# Patient Record
Sex: Male | Born: 1995 | Hispanic: Yes | Marital: Single | Smoking: Never smoker
Health system: Southern US, Community
[De-identification: ages and names within clinical notes are randomized; demographics above are authoritative.]

## PROBLEM LIST (undated history)

## (undated) DIAGNOSIS — J40 Bronchitis, not specified as acute or chronic: Secondary | ICD-10-CM

---

## 2015-04-10 ENCOUNTER — Emergency Department (HOSPITAL_COMMUNITY)
Admission: EM | Admit: 2015-04-10 | Discharge: 2015-04-10 | Disposition: A | Discharge status: Home / Self Care | Payer: Self-pay | Attending: Emergency Medicine | Admitting: Emergency Medicine

## 2015-04-10 ENCOUNTER — Encounter (HOSPITAL_COMMUNITY): Payer: Self-pay | Admitting: *Deleted

## 2015-04-10 DIAGNOSIS — Z8709 Personal history of other diseases of the respiratory system: Secondary | ICD-10-CM | POA: Insufficient documentation

## 2015-04-10 DIAGNOSIS — B349 Viral infection, unspecified: Secondary | ICD-10-CM | POA: Insufficient documentation

## 2015-04-10 HISTORY — DX: Bronchitis, not specified as acute or chronic: J40

## 2015-04-10 MED ORDER — ONDANSETRON HCL 4 MG PO TABS
4.0000 mg | ORAL_TABLET | Freq: Once | ORAL | Status: AC
  Administered 2015-04-10: 4 mg via ORAL
  Filled 2015-04-10: qty 1

## 2015-04-10 MED ORDER — METOCLOPRAMIDE HCL 5 MG PO TABS
5.0000 mg | ORAL_TABLET | Freq: Once | ORAL | Status: AC
  Administered 2015-04-10: 5 mg via ORAL
  Filled 2015-04-10: qty 1

## 2015-04-10 MED ORDER — IBUPROFEN 400 MG PO TABS
800.0000 mg | ORAL_TABLET | Freq: Once | ORAL | Status: AC
  Administered 2015-04-10: 800 mg via ORAL
  Filled 2015-04-10: qty 2

## 2015-04-10 MED ORDER — DIPHENHYDRAMINE HCL 25 MG PO CAPS
25.0000 mg | ORAL_CAPSULE | Freq: Once | ORAL | Status: AC
  Administered 2015-04-10: 25 mg via ORAL
  Filled 2015-04-10: qty 1

## 2015-04-10 NOTE — ED Provider Notes (Signed)
CSN: [id number]     Arrival date & time 04/10/15  2024 History  This chart was scribed for Eyvonne Mechanic, PA-C, working with Linwood Dibbles, MD by Elon Spanner, ED Scribe. This patient was seen in room TR01C/TR01C and the patient's care was started at 9:55 PM.     Chief Complaint  Patient presents with  . Generalized Body Aches   The history is provided by the patient. No language interpreter was used.   HPI Comments:  Robert Zimmerman is a 19 y.o. male who presents to the Emergency Department complaining of fatigue, generalized body aches and nausea; onset 11:00 am today.  Patient reports that he was able to work today but shortly after symptoms started he had to stop working. He reports that he took ibuprofen this slightly improved his muscular complaints. Patient notes he was able to eat lunch today, and 24 ounce Gatorade. He reports generalized headache, describes it as pulsating. Patient reports that his body aches are diffuse, worse with movement, no focal abnormalities. 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 Patient denies neck pain, stiffness, altered mental status, focal neurological deficits, weakness, chest pain, cough, fever, abdominal pain, changes in urine color clarity or characteristics, decreased range of motion of his joints. No swelling of the joints. Patient reports that he's had multiple episodes of the same presentation previously, approximately every 2 months. Wife is present at the time of evaluation who is working at [employer] nurse at his request, she reports that this happens often, uncertain etiology, usually resolves on its own. Patient denies any insect bites, exposure to abnormal food or drink, anyone around him with similar symptoms.   Past Medical History  Diagnosis Date  . Bronchitis    History reviewed. No pertinent past  surgical history. No family history on file. Social History  Substance Use Topics  . Smoking status: Never Smoker   . Smokeless tobacco: Never Used  . Alcohol Use: Yes     Comment: socially    Review of Systems  All other systems reviewed and are negative.   Allergies  Review of patient's allergies indicates no known allergies.  Home Medications   Prior to Admission medications   Not on File   BP 104/56 mmHg  Pulse 81  Temp(Src) 99.9 F (37.7 C) (Oral)  Resp 14  Ht 5\' 5"  (1.651 m)  Wt 191 lb 2 oz (86.694 kg)  BMI 31.80 kg/m2  SpO2 95%   Physical Exam  Constitutional: He is oriented to person, place, and time. He appears well-developed and well-nourished. No distress.  HENT:  Head: Normocephalic and atraumatic.  Mouth/Throat: Oropharynx is clear and moist. No oropharyngeal exudate.  Eyes: Conjunctivae and EOM are normal. Pupils are equal, round, and reactive to light.  Neck: Normal range of motion. Neck supple. No tracheal deviation present.  Cardiovascular: Normal rate, regular rhythm, normal heart sounds and intact distal pulses.  Exam reveals no gallop and no friction rub.   No murmur heard. Pulmonary/Chest: Effort normal and breath sounds normal. No respiratory distress. He has no wheezes. He has no rales. He exhibits no tenderness.  Abdominal: Soft. He exhibits no distension and no mass. There is no tenderness. There is no rebound and no guarding.  Musculoskeletal: Normal range of motion. He exhibits no edema or tenderness.  Neurological: He is alert and oriented to person, place, and time. He has normal strength.  No cranial nerve deficit or sensory deficit. GCS eye subscore is 4. GCS verbal subscore is 5. GCS motor subscore is 6.  Skin: Skin is warm and dry. He is not diaphoretic.  Psychiatric: He has a normal mood and affect. His behavior is normal.  Nursing note and vitals reviewed.   ED Course  Procedures (including critical care time) Labs Review Labs  Reviewed - No data to display  Imaging Review No results found. I have personally reviewed and evaluated these images and lab results as part of my medical decision-making.   EKG Interpretation None      MDM   Final diagnoses:  Viral illness   Labs:  Imaging:  Therapeutics: Ibuprofen 800 mg; Zofran 4 mg; Benadryl 25 mg; Zofran 4 mg; Reglan 5 mg  Assessment/Plan: Patient presents today with headache, diffuse body aches, fatigue. With patient's headache, meningitis was considered, patient has no altered mental status, he was originally afebrile without any recent antipyretics.He has full active range of motion of his neck and back, this is unlikely meningitis. Patient's presentation most likely represents the beginning of viral infection. He does have some nausea, but has no diarrhea at this time. Patient's vital signs are reassuring, he is nontoxic-appearing, tolerating by mouth. Patient was treated here in the ED with above medications with improvement in his headache symptoms. Due to the recent onset of symptoms it is difficult this time to determine acute cause for patient's symptoms. He is stable and will be discharged home with instructions to monitor for increase in temperature, nausea, vomiting, diarrhea, neck stiffness, changes in mental status, or any other significant sign or symptom. He is instructed return immediately to the emergency room if any present. Both him and his wife both verbalized understanding and agreement today's plan and had no further questions or concerns at time of discharge.  Discharge Meds: Ibuprofen  I personally performed the services described in this documentation, which was scribed in my presence. The recorded information has been reviewed and is accurate.    Eyvonne Mechanic, PA-C 04/13/15 1022  Linwood Dibbles, MD 04/16/15 [phone]

## 2015-04-10 NOTE — ED Notes (Signed)
Patient presents with c/o body aches and nausea  States it started this morning around 10am  Took 2 Aleve at noon and then noticed body aches

## 2015-04-10 NOTE — Discharge Instructions (Signed)
Please use ibuprofen or Tylenol as needed for pain and fever. If symptoms persist or worsen please return to the emergency room for further evaluation and management. He fluids.

## 2016-10-11 ENCOUNTER — Emergency Department (HOSPITAL_COMMUNITY): Payer: Self-pay

## 2016-10-11 ENCOUNTER — Emergency Department (HOSPITAL_COMMUNITY)
Admission: EM | Admit: 2016-10-11 | Discharge: 2016-10-12 | Disposition: A | Discharge status: Home / Self Care | Payer: Self-pay | Attending: Emergency Medicine | Admitting: Emergency Medicine

## 2016-10-11 ENCOUNTER — Encounter (HOSPITAL_COMMUNITY): Payer: Self-pay

## 2016-10-11 DIAGNOSIS — R109 Unspecified abdominal pain: Secondary | ICD-10-CM

## 2016-10-11 DIAGNOSIS — K760 Fatty (change of) liver, not elsewhere classified: Secondary | ICD-10-CM | POA: Insufficient documentation

## 2016-10-11 DIAGNOSIS — R945 Abnormal results of liver function studies: Secondary | ICD-10-CM | POA: Insufficient documentation

## 2016-10-11 DIAGNOSIS — R3129 Other microscopic hematuria: Secondary | ICD-10-CM | POA: Insufficient documentation

## 2016-10-11 DIAGNOSIS — R7989 Other specified abnormal findings of blood chemistry: Secondary | ICD-10-CM

## 2016-10-11 LAB — URINALYSIS, ROUTINE W REFLEX MICROSCOPIC
BILIRUBIN URINE: NEGATIVE
Bacteria, UA: NONE SEEN
GLUCOSE, UA: NEGATIVE mg/dL
KETONES UR: NEGATIVE mg/dL
LEUKOCYTES UA: NEGATIVE
NITRITE: NEGATIVE
PROTEIN: NEGATIVE mg/dL
Specific Gravity, Urine: 1.023 (ref 1.005–1.030)
Squamous Epithelial / LPF: NONE SEEN
WBC, UA: NONE SEEN WBC/hpf (ref 0–5)
pH: 7 (ref 5.0–8.0)

## 2016-10-11 MED ORDER — SODIUM CHLORIDE 0.9 % IV BOLUS (SEPSIS)
1000.0000 mL | Freq: Once | INTRAVENOUS | Status: AC
  Administered 2016-10-11: 1000 mL via INTRAVENOUS

## 2016-10-11 MED ORDER — MORPHINE SULFATE (PF) 4 MG/ML IV SOLN
4.0000 mg | Freq: Once | INTRAVENOUS | Status: AC
  Administered 2016-10-11: 4 mg via INTRAVENOUS
  Filled 2016-10-11: qty 1

## 2016-10-11 NOTE — ED Notes (Signed)
Patient transported to X-ray 

## 2016-10-11 NOTE — ED Triage Notes (Signed)
Pt complaining of L sided flank pain. Pt denies any injury/trauma. Pt complaining of burning with urination. No hx of same.

## 2016-10-11 NOTE — ED Provider Notes (Signed)
[facility]-EMERGENCY DEPT Provider Note   CSN: [id number] Arrival date & time: 10/11/16  2222     History   Chief Complaint Chief Complaint  Patient presents with  . Flank Pain    HPI Sire Poet is a 21 y.o. male with a PMHx of bronchitis, who presents to the ED with complaints of sudden onset left flank pain that began at 10 PM approximately 1 hour prior to evaluation. He describes the pain as 10/10 constant sharp left flank pain radiating to the left lateral and lower abdomen, with no known aggravating factors, and no known alleviating factors given that he has not tried anything prior to arrival. He states that earlier this morning he noticed he had some burning dysuria with urination and cloudy urine. He denies any malodorous urine, hematuria, increased urinary frequency, testicular pain or swelling, penile discharge, fevers, chills, CP, SOB, N/V/D/C, obstipation, melena, hematochezia, myalgias, arthralgias, numbness, tingling, focal weakness, or any other complaints at this time. Denies recent travel, sick contacts, suspicious food intake, alcohol use, NSAIDs use, or prior abdominal surgeries. Denies history of kidney stones or any similar symptoms like tonight.   The history is provided by the patient and medical records. No language interpreter was used.  Flank Pain  This is a new problem. The current episode started less than 1 hour ago. The problem occurs constantly. The problem has not changed since onset.Associated symptoms include abdominal pain. Pertinent negatives include no chest pain and no shortness of breath. Nothing aggravates the symptoms. Nothing relieves the symptoms. He has tried nothing for the symptoms. The treatment provided no relief.    Past Medical History:  Diagnosis Date  . Bronchitis     There are no active problems to display for this patient.   History reviewed. No pertinent surgical history.     Home Medications    Prior to Admission medications     Not on File    Family History History reviewed. No pertinent family history.  Social History Social History  Substance Use Topics  . Smoking status: Never Smoker  . Smokeless tobacco: Never Used  . Alcohol use Yes     Comment: socially     Allergies   Patient has no known allergies.   Review of Systems Review of Systems  Constitutional: Negative for chills and fever.  Respiratory: Negative for shortness of breath.   Cardiovascular: Negative for chest pain.  Gastrointestinal: Positive for abdominal pain. Negative for blood in stool, constipation, diarrhea, nausea and vomiting.  Genitourinary: Positive for dysuria and flank pain. Negative for discharge, frequency, hematuria, scrotal swelling and testicular pain.       +cloudy urine, not malodorous  Musculoskeletal: Negative for arthralgias and myalgias.  Skin: Negative for color change.  Allergic/Immunologic: Negative for immunocompromised state.  Neurological: Negative for weakness and numbness.  Psychiatric/Behavioral: Negative for confusion.   10 Systems reviewed and are negative for acute change except as noted in the HPI.   Physical Exam Updated Vital Signs BP 115/82 (BP Location: Right Arm)   Pulse 78   Temp 98.4 F (36.9 C) (Oral)   Resp 17   SpO2 98%   Physical Exam  Constitutional: He is oriented to person, place, and time. Vital signs are normal. He appears well-developed and well-nourished.  Non-toxic appearance. No distress.  Afebrile, nontoxic, NAD  HENT:  Head: Normocephalic and atraumatic.  Mouth/Throat: Oropharynx is clear and moist and mucous membranes are normal.  Eyes: Conjunctivae and EOM are normal. Right eye exhibits no  discharge. Left eye exhibits no discharge.  Neck: Normal range of motion. Neck supple.  Cardiovascular: Normal rate, regular rhythm, normal heart sounds and intact distal pulses.  Exam reveals no gallop and no friction rub.   No murmur heard. Pulmonary/Chest: Effort normal  and breath sounds normal. No respiratory distress. He has no decreased breath sounds. He has no wheezes. He has no rhonchi. He has no rales.  Abdominal: Soft. Normal appearance and bowel sounds are normal. He exhibits no distension. There is tenderness in the left lower quadrant. There is CVA tenderness. There is no rigidity, no rebound, no guarding, no tenderness at McBurney's point and negative Murphy's sign.    Soft, nondistended, +BS throughout, with mild LLQ TTP tracking towards the L lateral abdomen and flank region, no r/g/r, neg murphy's, neg mcburney's, +L-sided CVA TTP   Musculoskeletal: Normal range of motion.  Neurological: He is alert and oriented to person, place, and time. He has normal strength. No sensory deficit.  Skin: Skin is warm, dry and intact. No rash noted.  Psychiatric: He has a normal mood and affect.  Nursing note and vitals reviewed.    ED Treatments / Results  Labs (all labs ordered are listed, but only abnormal results are displayed) Labs Reviewed  URINALYSIS, ROUTINE W REFLEX MICROSCOPIC - Abnormal; Notable for the following:       Result Value   APPearance HAZY (*)    Hgb urine dipstick LARGE (*)    All other components within normal limits  COMPREHENSIVE METABOLIC PANEL - Abnormal; Notable for the following:    Glucose, Bld 125 (*)    AST 42 (*)    ALT 79 (*)    All other components within normal limits  CBC WITH DIFFERENTIAL/PLATELET  LIPASE, BLOOD    EKG  EKG Interpretation None       Radiology Ct Renal Stone Study  Result Date: 10/12/2016 CLINICAL DATA:  Left flank pain. No injury. Burning with urination. EXAM: CT ABDOMEN AND PELVIS WITHOUT CONTRAST TECHNIQUE: Multidetector CT imaging of the abdomen and pelvis was performed following the standard protocol without IV contrast. COMPARISON:  None. FINDINGS: Lower chest: Atelectasis in the lung bases. Hepatobiliary: Diffuse fatty infiltration of the liver. Gallbladder is contracted but appears  otherwise normal. No stones identified. No bile duct dilatation. Pancreas: Unremarkable. No pancreatic ductal dilatation or surrounding inflammatory changes. Spleen: Normal in size without focal abnormality. Adrenals/Urinary Tract: No adrenal gland nodules. Kidneys are symmetrical. No hydronephrosis or hydroureter. No renal, ureteral, or bladder stones. Bladder is decompressed. Stomach/Bowel: Stomach is unremarkable and filled with fluid and food. Small bowel are decompressed. Stool throughout the colon. No colonic distention or wall thickening. Appendix is normal. Vascular/Lymphatic: No significant vascular findings are present. No enlarged abdominal or pelvic lymph nodes. Reproductive: Prostate gland is enlarged, measuring 4.9 cm diameter. Consider prostatitis in a patient of this age. Other: No free air or free fluid. Abdominal wall musculature appears intact. Musculoskeletal: No acute or significant osseous findings. IMPRESSION: No renal or ureteral stone or obstruction. Diffuse fatty infiltration of the liver. Prominent prostate enlargement especially for patient age. Electronically Signed   By: Burman NievesWilliam  Stevens M.D.   On: 10/12/2016 00:08    Procedures Procedures (including critical care time)  Medications Ordered in ED Medications  morphine 4 MG/ML injection 4 mg (4 mg Intravenous Given 10/11/16 2352)  sodium chloride 0.9 % bolus 1,000 mL (0 mLs Intravenous Stopped 10/12/16 0054)     Initial Impression / Assessment and Plan / ED  Course  I have reviewed the triage vital signs and the nursing notes.  Pertinent labs & imaging results that were available during my care of the patient were reviewed by me and considered in my medical decision making (see chart for details).     21 y.o. male here with sudden onset L flank pain radiating to LLQ, mild dysuria and cloudy urine starting this morning. On exam, LLQ TTP tracking towards the L flank, +L CVA TTP, nonperitoneal. U/A obtained in triage which  shows TNTC RBCs but no WBCs or bacteria, leuk and nitrite negative. DDx includes nephrolithiasis vs pancreatic etiology vs intestinal etiology. Will get labs and CT renal study. Will give pain meds and fluids, then reassess shortly  1:42 AM CBC w/diff unremarkable. CMP with mildly elevated LFTs (AST 42, ALT 79) but otherwise unremarkable. Lipase WNL. CT without evidence of kidney stone, diffuse fatty liver which would explain the marginally elevated LFTs; mild prostatic enlargement for age. Given lack of bacteria or infectious signs on U/A, and lack of penile discharge/fever/leukocytosis, doubt prostatitis, likely benign enlargement. Symptoms could be from passed kidney stone, especially given the hematuria on U/A. Pt feeling much better, pain resolved, no return of symptoms. Will d/c home with pain meds. [place] reviewed prior to dispensing controlled substance medications, and was notable for: NONE found on 1 year search. Risks/benefits/alternatives and expectations discussed regarding controlled substances. Side effects of medications discussed. Informed consent obtained.   Advised pt to stay well hydrated, and f/up with [facility] in 1wk for recheck of symptoms and to establish medical care; doubt need for urology f/up at this time, but if symptoms return/persist, or he has prostatic symptoms, then it may warrant urology f/up; will defer to [facility]/PCP for referral decisions.   I explained the diagnosis and have given explicit precautions to return to the ER including for any other new or worsening symptoms. The patient understands and accepts the medical plan as it's been dictated and I have answered their questions. Discharge instructions concerning home care and prescriptions have been given. The patient is STABLE and is discharged to home in good condition.    Final Clinical Impressions(s) / ED Diagnoses   Final diagnoses:  Left flank pain  Other microscopic hematuria  Fatty liver  Elevated  LFTs    New Prescriptions New Prescriptions   HYDROCODONE-ACETAMINOPHEN (NORCO) 5-325 MG TABLET    Take 1 tablet by mouth every 6 (six) hours as needed for severe pain.   NAPROXEN (NAPROSYN) 500 MG TABLET    Take 1 tablet (500 mg total) by mouth 2 (two) times daily as needed for mild pain, moderate pain or headache (TAKE WITH MEALS.).     53 Linda Ocia Simek, PA-C 10/12/16 8119    Derwood Kaplan, MD 10/12/16 2308

## 2016-10-12 LAB — COMPREHENSIVE METABOLIC PANEL
ALBUMIN: 3.9 g/dL (ref 3.5–5.0)
ALT: 79 U/L — AB (ref 17–63)
AST: 42 U/L — AB (ref 15–41)
Alkaline Phosphatase: 68 U/L (ref 38–126)
Anion gap: 8 (ref 5–15)
BUN: 11 mg/dL (ref 6–20)
CHLORIDE: 105 mmol/L (ref 101–111)
CO2: 25 mmol/L (ref 22–32)
CREATININE: 0.78 mg/dL (ref 0.61–1.24)
Calcium: 9 mg/dL (ref 8.9–10.3)
GFR calc Af Amer: >60 mL/min (ref >60)
GLUCOSE: 125 mg/dL — AB (ref 65–99)
Potassium: 3.8 mmol/L (ref 3.5–5.1)
Sodium: 138 mmol/L (ref 135–145)
Total Bilirubin: 0.7 mg/dL (ref 0.3–1.2)
Total Protein: 6.9 g/dL (ref 6.5–8.1)

## 2016-10-12 LAB — CBC WITH DIFFERENTIAL/PLATELET
BASOS ABS: 0 10*3/uL (ref 0.0–0.1)
Basophils Relative: 0 %
EOS PCT: 2 %
Eosinophils Absolute: 0.2 10*3/uL (ref 0.0–0.7)
HCT: 43.2 % (ref 39.0–52.0)
Hemoglobin: 14.4 g/dL (ref 13.0–17.0)
LYMPHS PCT: 28 %
Lymphs Abs: 2.6 10*3/uL (ref 0.7–4.0)
MCH: 30.8 pg (ref 26.0–34.0)
MCHC: 33.3 g/dL (ref 30.0–36.0)
MCV: 92.5 fL (ref 78.0–100.0)
Monocytes Absolute: 1 10*3/uL (ref 0.1–1.0)
Monocytes Relative: 11 %
Neutro Abs: 5.3 10*3/uL (ref 1.7–7.7)
Neutrophils Relative %: 59 %
PLATELETS: 214 10*3/uL (ref 150–400)
RBC: 4.67 MIL/uL (ref 4.22–5.81)
RDW: 13 % (ref 11.5–15.5)
WBC: 9.2 10*3/uL (ref 4.0–10.5)

## 2016-10-12 LAB — LIPASE, BLOOD: LIPASE: 20 U/L (ref 11–51)

## 2016-10-12 MED ORDER — HYDROCODONE-ACETAMINOPHEN 5-325 MG PO TABS: 1.0000 | ORAL_TABLET | Freq: Four times a day (QID) | ORAL | 0 refills | Status: AC | PRN

## 2016-10-12 MED ORDER — NAPROXEN 500 MG PO TABS: 500.0000 mg | ORAL_TABLET | Freq: Two times a day (BID) | ORAL | 0 refills | Status: AC | PRN

## 2016-10-12 NOTE — Discharge Instructions (Signed)
Your lab work showed that you have mildly elevated liver enzymes, likely related to a fatty liver which was seen on CT scan. You also have a mildly enlarged prostate. No kidney stones were seen on the CT scan, however you could have passed the kidney stone already, which is why you had blood in your urine test. Take naprosyn as directed as needed for pain using norco as directed as needed for breakthrough pain. Do not drive or operate machinery with pain medication use. May need over-the-counter stool softener with this pain medication use. Follow-up with [facility] in the next 1 to 2 weeks for recheck of ongoing pain and to establish medical care, however for intractable or uncontrollable pain at home or any other changes/worsening symptoms, then return to the  emergency department.

## 2018-05-01 IMAGING — CT CT RENAL STONE PROTOCOL
2 of 4 series · 17 of 46 positions shown, 19 images · non-contrast
Comparison: None.

CLINICAL DATA: Left flank pain. No injury. Burning with urination.

EXAM:
CT ABDOMEN AND PELVIS WITHOUT CONTRAST
TECHNIQUE: Multidetector CT imaging of the abdomen and pelvis was performed
following the standard protocol without IV contrast.

[Series 3: stone study 5.0 i30f 2 · axial · 0.80mm/px · z∈[+830,+1260]mm · 14 of 94 slices shown, 16 images]
[im 4/94  soft-tissue]
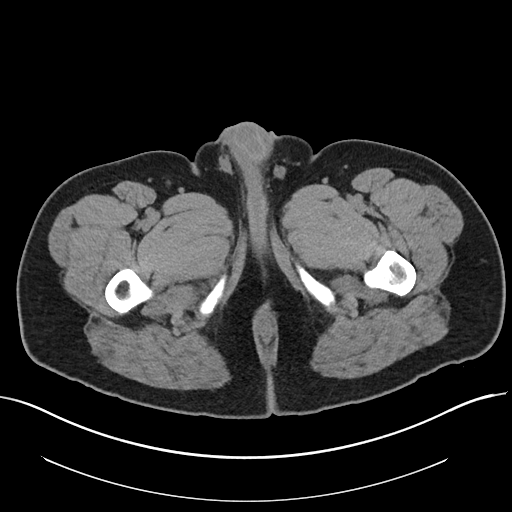
[im 4/94  bone]
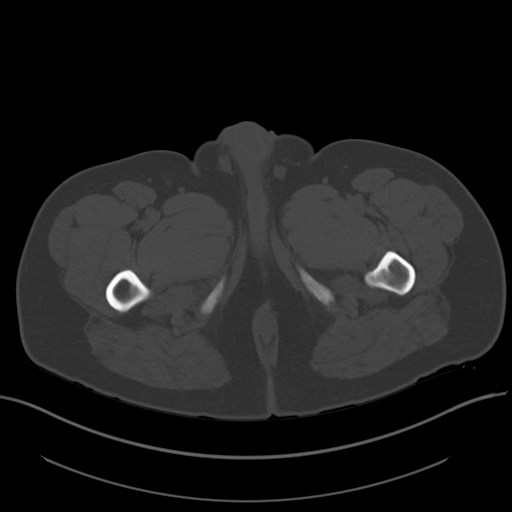
[im 10/94  soft-tissue]
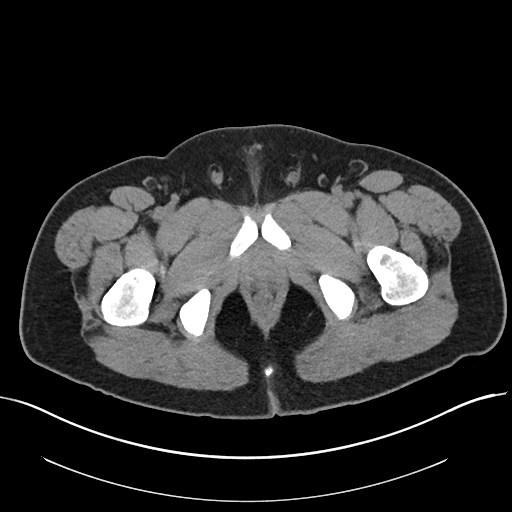
[im 17/94  soft-tissue]
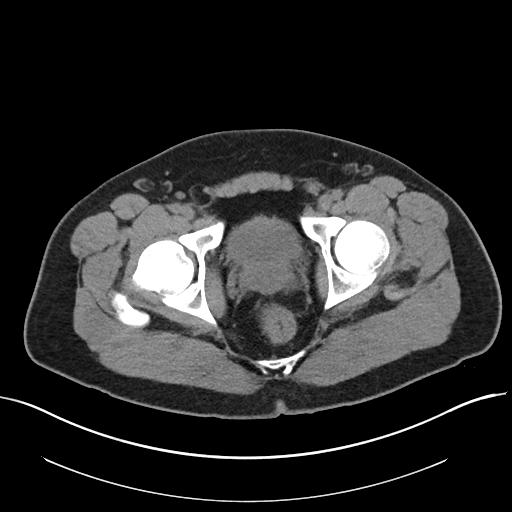
[im 24/94  soft-tissue]
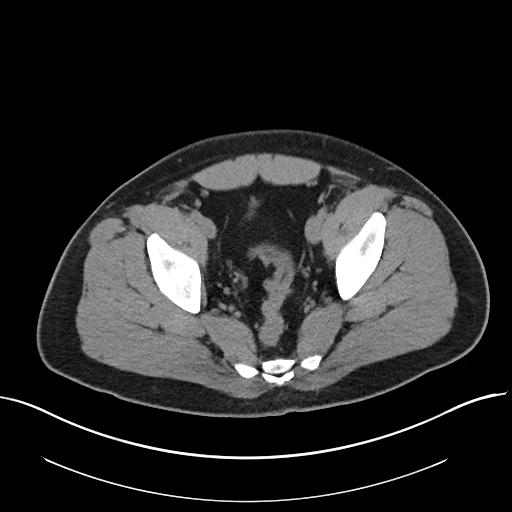
[im 30/94  soft-tissue]
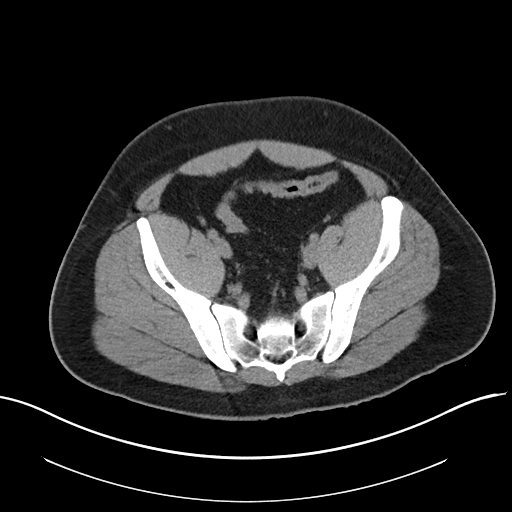
[im 37/94  soft-tissue]
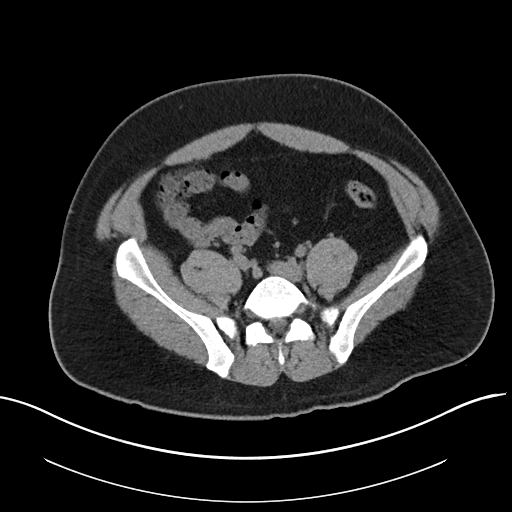
[im 44/94  soft-tissue]
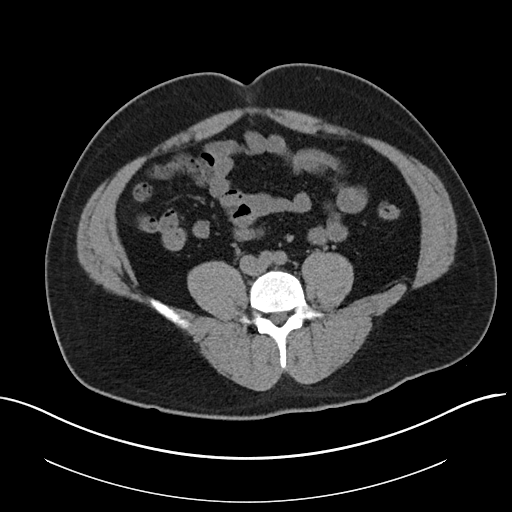
[im 50/94  soft-tissue]
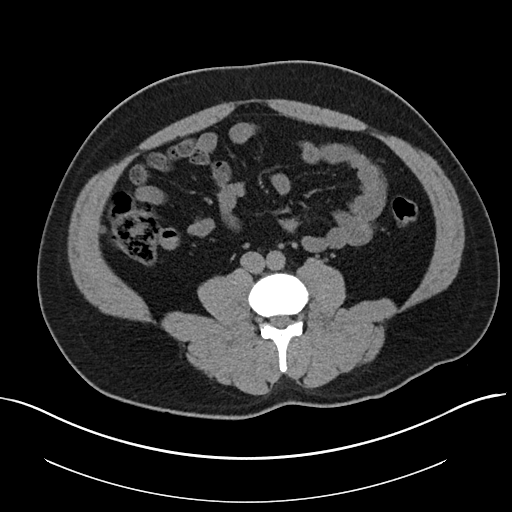
[im 57/94  soft-tissue]
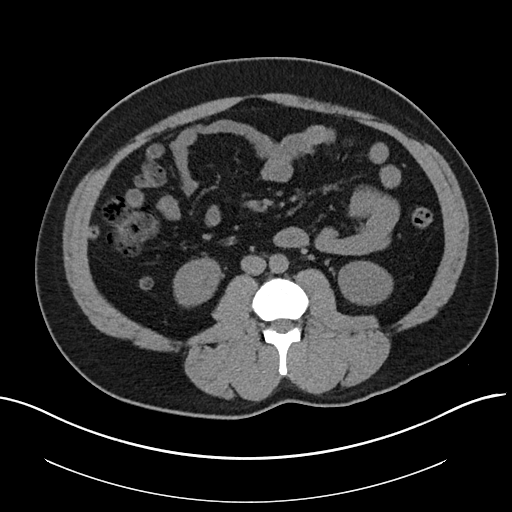
[im 57/94  bone]
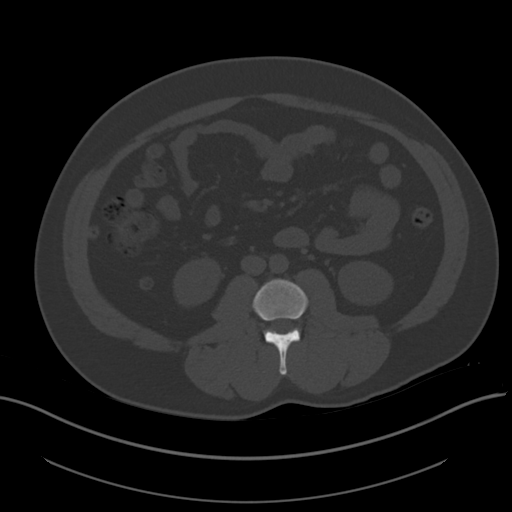
[im 64/94  soft-tissue]
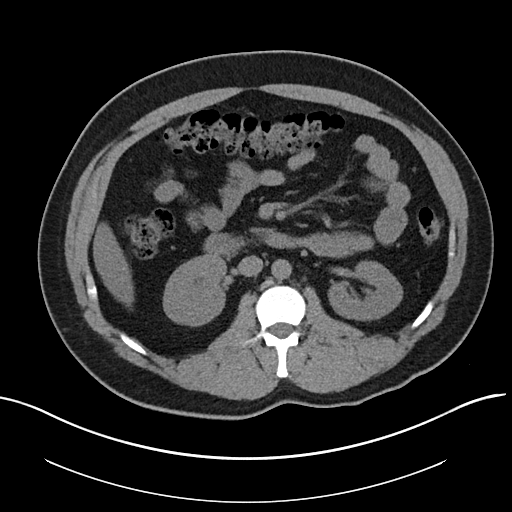
[im 70/94  soft-tissue]
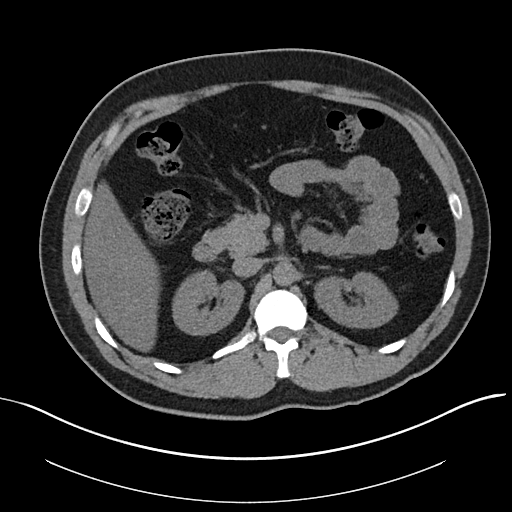
[im 77/94  soft-tissue]
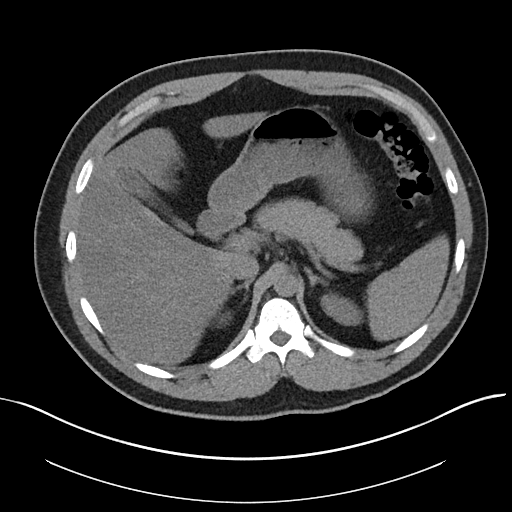
[im 84/94  soft-tissue]
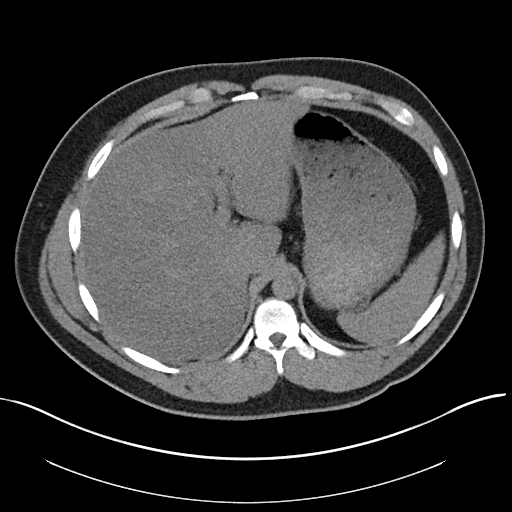
[im 90/94  soft-tissue]
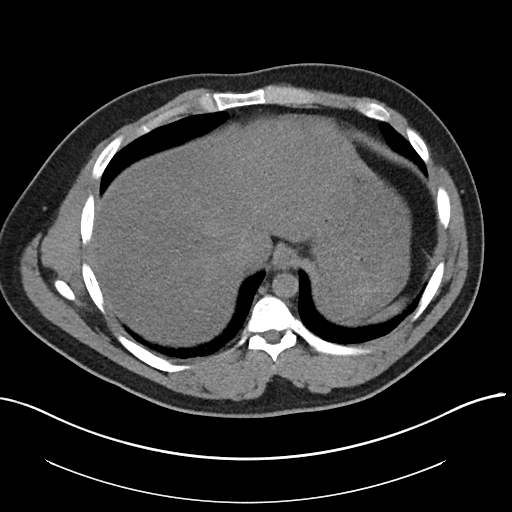

[Series 6: coronal soft tissue · coronal · 0.84mm/px · 3 of 91 slices shown]
[im 31/91  soft-tissue]
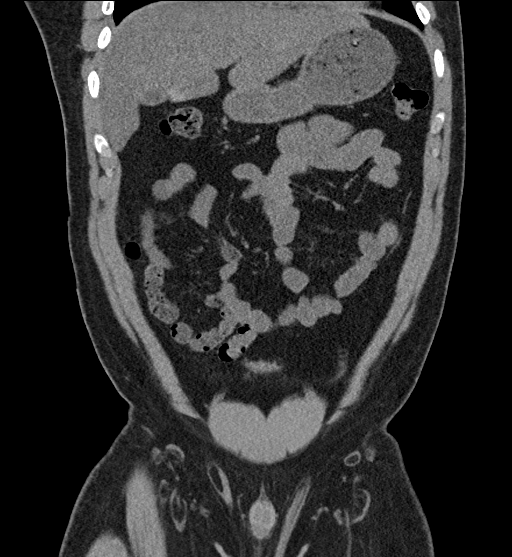
[im 41/91  soft-tissue]
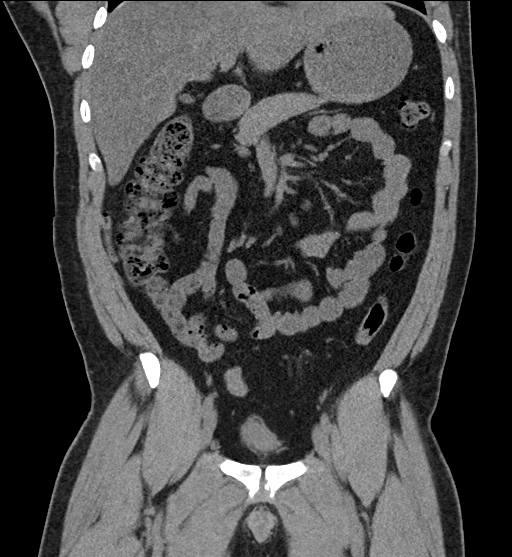
[im 51/91  soft-tissue]
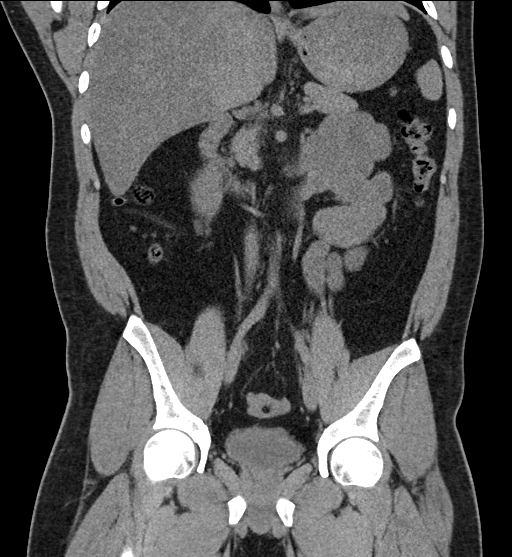

[17 of 46 positions shown; findings below may reference images not displayed]

FINDINGS: Lower chest: Atelectasis in the lung bases.

Hepatobiliary: Diffuse fatty infiltration of the liver. Gallbladder
is contracted but appears otherwise normal. No stones identified. No
bile duct dilatation.

Pancreas: Unremarkable. No pancreatic ductal dilatation or
surrounding inflammatory changes.

Spleen: Normal in size without focal abnormality.

Adrenals/Urinary Tract: No adrenal gland nodules. Kidneys are
symmetrical. No hydronephrosis or hydroureter. No renal, ureteral,
or bladder stones. Bladder is decompressed.

Stomach/Bowel: Stomach is unremarkable and filled with fluid and
food. Small bowel are decompressed. Stool throughout the colon. No
colonic distention or wall thickening. Appendix is normal.

Vascular/Lymphatic: No significant vascular findings are present. No
enlarged abdominal or pelvic lymph nodes.

Reproductive: Prostate gland is enlarged, measuring 4.9 cm diameter.
Consider prostatitis in a patient of this age.

Other: No free air or free fluid. Abdominal wall musculature appears
intact.

Musculoskeletal: No acute or significant osseous findings.
IMPRESSION: No renal or ureteral stone or obstruction. Diffuse fatty
infiltration of the liver. Prominent prostate enlargement especially
for patient age.
